# Patient Record
Sex: Female | Born: 1995 | Race: Black or African American | Hispanic: No | Marital: Single | Smoking: Current every day smoker
Health system: Southern US, Community
[De-identification: ages and names within clinical notes are randomized; demographics above are authoritative.]

---

## 2017-03-22 ENCOUNTER — Ambulatory Visit (HOSPITAL_COMMUNITY)
Admission: EM | Admit: 2017-03-22 | Discharge: 2017-03-22 | Disposition: A | Discharge status: Home / Self Care | Payer: BLUE CROSS/BLUE SHIELD | Attending: Physician Assistant | Admitting: Physician Assistant

## 2017-03-22 DIAGNOSIS — R509 Fever, unspecified: Secondary | ICD-10-CM | POA: Diagnosis not present

## 2017-03-22 DIAGNOSIS — Z3202 Encounter for pregnancy test, result negative: Secondary | ICD-10-CM

## 2017-03-22 DIAGNOSIS — R51 Headache: Secondary | ICD-10-CM | POA: Diagnosis not present

## 2017-03-22 DIAGNOSIS — R05 Cough: Secondary | ICD-10-CM | POA: Diagnosis not present

## 2017-03-22 DIAGNOSIS — M791 Myalgia, unspecified site: Secondary | ICD-10-CM | POA: Diagnosis not present

## 2017-03-22 DIAGNOSIS — Z20828 Contact with and (suspected) exposure to other viral communicable diseases: Secondary | ICD-10-CM | POA: Diagnosis not present

## 2017-03-22 DIAGNOSIS — R69 Illness, unspecified: Principal | ICD-10-CM

## 2017-03-22 DIAGNOSIS — J111 Influenza due to unidentified influenza virus with other respiratory manifestations: Secondary | ICD-10-CM

## 2017-03-22 LAB — POCT PREGNANCY, URINE: Preg Test, Ur: NEGATIVE

## 2017-03-22 MED ORDER — OSELTAMIVIR PHOSPHATE 75 MG PO CAPS: 75.0000 mg | ORAL_CAPSULE | Freq: Two times a day (BID) | ORAL | 0 refills | Status: AC

## 2017-03-22 MED ORDER — IBUPROFEN 600 MG PO TABS: 600.0000 mg | ORAL_TABLET | Freq: Four times a day (QID) | ORAL | 0 refills | Status: AC | PRN

## 2017-03-22 NOTE — ED Notes (Signed)
Seen, and tiraged by Deliah Bostonmichael clark, provider

## 2017-03-22 NOTE — Discharge Instructions (Signed)
Your pregnancy test is negative today.  Take the Tamiflu.  Take ibuprofen 600 mg as prescribed every 6-8 hours as needed.

## 2017-03-22 NOTE — ED Provider Notes (Signed)
03/22/2017 7:08 PM   DOB: [birth year] / MRN: [id number]  SUBJECTIVE:  Marilyn Finley is a 22 y.o. female presenting for cough, fever T-max 102, chills, body ache, headaches.  She associates fatigue.  She tells me that her nephew was recently diagnosed with the flu.  She has No Known Allergies.   She  has no past medical history on file.    She   She  has no sexual activity history on file. The patient  has no past surgical history on file.  Her family history is not on file.  Review of Systems  Constitutional: Negative for diaphoresis.  Respiratory: Negative for hemoptysis and shortness of breath.   Cardiovascular: Negative for chest pain.  Gastrointestinal: Negative for nausea.  Skin: Negative for rash.  Neurological: Negative for dizziness.    OBJECTIVE:  BP 124/82 (BP Location: Left Arm)   Pulse (!) 138 Comment: Notified Tina  Temp (!) 102.6 F (39.2 C) (Oral) Comment: Notified Tina G  Resp 20   SpO2 98%   Physical Exam  Constitutional: She is active.  Non-toxic appearance.  HENT:  Right Ear: Hearing, tympanic membrane, external ear and ear canal normal.  Left Ear: Hearing, tympanic membrane, external ear and ear canal normal.  Nose: Nose normal. Right sinus exhibits no maxillary sinus tenderness and no frontal sinus tenderness. Left sinus exhibits no maxillary sinus tenderness and no frontal sinus tenderness.  Mouth/Throat: Uvula is midline, oropharynx is clear and moist and mucous membranes are normal. Mucous membranes are not dry. No oropharyngeal exudate, posterior oropharyngeal edema or tonsillar abscesses.  Cardiovascular: Normal rate, regular rhythm, S1 normal, S2 normal, normal heart sounds and intact distal pulses. Exam reveals no gallop, no friction rub and no decreased pulses.  No murmur heard. Pulmonary/Chest: Effort normal. No stridor. No tachypnea. No respiratory distress. She has no wheezes. She has no rales.  Abdominal: She exhibits no distension.   Musculoskeletal: She exhibits no edema.  Lymphadenopathy:       Head (right side): No submandibular and no tonsillar adenopathy present.       Head (left side): No submandibular and no tonsillar adenopathy present.    She has no cervical adenopathy.  Neurological: She is alert.  Skin: Skin is warm and dry. She is not diaphoretic. No pallor.    Results for orders placed or performed during the hospital encounter of 03/22/17 (from the past 72 hour(s))  Pregnancy, urine POC     Status: None   Collection Time: 03/22/17  7:05 PM  Result Value Ref Range   Preg Test, Ur NEGATIVE NEGATIVE    Comment:        THE SENSITIVITY OF THIS METHODOLOGY IS >24 mIU/mL     No results found.  ASSESSMENT AND PLAN:     Influenza-like illness: Will start Tamiflu.  Symptomatic medication also prescribed for fever.      The patient is advised to call or return to clinic if she does not see an improvement in symptoms, or to seek the care of the closest emergency department if she worsens with the above plan.   Deliah BostonMichael Alandis Bluemel, MHS, PA-C 03/22/2017 7:08 PM    Ofilia Neaslark, Lucia Harm L, PA-C 03/22/17 Windell [id number]

## 2018-01-09 ENCOUNTER — Emergency Department (HOSPITAL_COMMUNITY)
Admission: EM | Admit: 2018-01-09 | Discharge: 2018-01-10 | Disposition: A | Discharge status: Home / Self Care | Payer: BLUE CROSS/BLUE SHIELD | Attending: Emergency Medicine | Admitting: Emergency Medicine

## 2018-01-09 ENCOUNTER — Encounter (HOSPITAL_COMMUNITY): Payer: Self-pay | Admitting: Emergency Medicine

## 2018-01-09 ENCOUNTER — Other Ambulatory Visit: Payer: Self-pay

## 2018-01-09 DIAGNOSIS — Z79899 Other long term (current) drug therapy: Secondary | ICD-10-CM | POA: Diagnosis not present

## 2018-01-09 DIAGNOSIS — F17200 Nicotine dependence, unspecified, uncomplicated: Secondary | ICD-10-CM | POA: Insufficient documentation

## 2018-01-09 DIAGNOSIS — R0789 Other chest pain: Secondary | ICD-10-CM | POA: Diagnosis not present

## 2018-01-09 NOTE — ED Triage Notes (Addendum)
Pt c/o mid chest pain and mid / upper back pain x 2 days. Pt states pain is constant and she has not taken any meds for the pain. Pt states pain worsens with movement.

## 2018-01-09 NOTE — ED Notes (Signed)
Patient reports going to the gym yesterday and working out her shoulders and triceps. After she got back from the gym she noticed the pain, 4/10 in her upper back and L chest

## 2018-01-10 ENCOUNTER — Emergency Department (HOSPITAL_COMMUNITY): Payer: BLUE CROSS/BLUE SHIELD

## 2018-01-10 MED ORDER — IBUPROFEN 200 MG PO TABS
600.0000 mg | ORAL_TABLET | Freq: Once | ORAL | Status: AC
  Administered 2018-01-10: 600 mg via ORAL
  Filled 2018-01-10: qty 3

## 2018-01-10 NOTE — ED Provider Notes (Signed)
[facility]-EMERGENCY DEPT Provider Note   CSN: [id number] Arrival date & time: 01/09/18  2144  Time seen 23:55 PM    History   Chief Complaint Chief Complaint  Patient presents with  . Chest Pain  . Back Pain    HPI Marilyn Finley is a 22 y.o. female.  HPI patient states last night she started having left upper chest pain that she describes as dull, tight, and aching and states it waxes and wanes.  It is worse with movement and feels better with sitting still.  She states she feels like it is harder to breathe but she denies shortness of breath.  She has a mild cough that started today and she is coughing up white and sometimes brown or yellow mucus.  She denies fever, nausea, vomiting, sore throat, rhinorrhea, or sneezing.  She also states she has some pain between her shoulder blades is also made worse with movement.  She denies any pain or swelling of her legs, she denies being on any type of hormonal treatment, she denies any recent travel.  I asked her about any change in her activity and she states there was none.  She states she goes to the gym however she did the usual routines that she normally does without injury.  PCP [facility] at [facility]   No past medical history on file.  There are no active problems to display for this patient.    The histories are not reviewed yet. Please review them in the "History" navigator section and refresh this SmartLink.   OB History   No obstetric history on file.      Home Medications    Prior to Admission medications   Medication Sig Start Date End Date Taking? Authorizing Provider  ibuprofen (ADVIL,MOTRIN) 600 MG tablet Take 1 tablet (600 mg total) by mouth every 6 (six) hours as needed. Patient not taking: Reported on 01/10/2018 03/22/17   Ofilia Neas, PA-C  oseltamivir (TAMIFLU) 75 MG capsule Take 1 capsule (75 mg total) by mouth every 12 (twelve) hours. Patient not taking: Reported on 01/10/2018  03/22/17   Ofilia Neas, PA-C    Family History No family history on file.  Social History Social History   Tobacco Use  . Smoking status: Current Every Day Smoker  . Smokeless tobacco: Never Used  Substance Use Topics  . Alcohol use: Yes  . Drug use: Never  employed Denies smoking cigarettes, she smokes tobacco in a pipe   Allergies   Patient has no known allergies.   Review of Systems Review of Systems  All other systems reviewed and are negative.    Physical Exam Updated Vital Signs BP 132/86   Pulse 75   Temp 98.3 F (36.8 C) (Oral)   Resp 15   Ht 5\' 1"  (1.549 m)   Wt 63.5 kg   LMP 01/03/2018 (Exact Date)   SpO2 99%   BMI 26.45 kg/m   Vital signs normal    Physical Exam Vitals signs and nursing note reviewed.  Constitutional:      General: She is not in acute distress.    Appearance: Normal appearance. She is well-developed. She is not ill-appearing or toxic-appearing.  HENT:     Head: Normocephalic and atraumatic.     Right Ear: External ear normal.     Left Ear: External ear normal.     Nose: Nose normal. No mucosal edema or rhinorrhea.     Mouth/Throat:  Dentition: No dental abscesses.     Pharynx: No uvula swelling.  Eyes:     Conjunctiva/sclera: Conjunctivae normal.     Pupils: Pupils are equal, round, and reactive to light.  Neck:     Musculoskeletal: Full passive range of motion without pain, normal range of motion and neck supple.  Cardiovascular:     Rate and Rhythm: Normal rate and regular rhythm.     Heart sounds: Normal heart sounds. No murmur. No friction rub. No gallop.   Pulmonary:     Effort: Pulmonary effort is normal. No respiratory distress.     Breath sounds: Normal breath sounds. No wheezing, rhonchi or rales.  Chest:     Chest wall: No tenderness or crepitus.       Comments: Area of pain noted Abdominal:     General: Bowel sounds are normal. There is no distension.     Palpations: Abdomen is soft.      Tenderness: There is no abdominal tenderness. There is no guarding or rebound.  Musculoskeletal: Normal range of motion.        General: No tenderness.       Back:     Comments: Moves all extremities well. Area of pain noted.  No pain to palpation and when I do range of motion of her arm it also does not reproduce pain.  Skin:    General: Skin is warm and dry.     Coloration: Skin is not pale.     Findings: No erythema or rash.  Neurological:     Mental Status: She is alert and oriented to person, place, and time.     Cranial Nerves: No cranial nerve deficit.  Psychiatric:        Mood and Affect: Mood is not anxious.        Speech: Speech normal.        Behavior: Behavior normal.      ED Treatments / Results  Labs (all labs ordered are listed, but only abnormal results are displayed) Labs Reviewed - No data to display  EKG EKG Interpretation  Date/Time:  Saturday January 09 2018 21:59:55 EST Ventricular Rate:  94 PR Interval:    QRS Duration: 68 QT Interval:  353 QTC Calculation: 442 R Axis:   81 Text Interpretation:  Sinus rhythm Probable left atrial enlargement No old tracing to compare Confirmed by Linwood Dibbles [phone]) on 01/09/2018 10:04:24 PM   Radiology Dg Chest 2 View  Result Date: 01/10/2018 CLINICAL DATA:  LEFT upper chest and back pain for a day, productive cough. EXAM: CHEST - 2 VIEW COMPARISON:  None. FINDINGS: Cardiomediastinal silhouette is normal. No pleural effusions or focal consolidations. Trachea projects midline and there is no pneumothorax. Soft tissue planes and included osseous structures are non-suspicious. IMPRESSION: Normal chest. Electronically Signed   By: Awilda Metro M.D.   On: 01/10/2018 00:30    Procedures Procedures (including critical care time)  Medications Ordered in ED Medications  ibuprofen (ADVIL,MOTRIN) tablet 600 mg (600 mg Oral Given 01/10/18 0025)     Initial Impression / Assessment and Plan / ED Course  I have  reviewed the triage vital signs and the nursing notes.  Pertinent labs & imaging results that were available during my care of the patient were reviewed by me and considered in my medical decision making (see chart for details).     Patient reports she had URI symptoms within the last 2 weeks and got better and then her cough restarted today.  Chest x-ray was done.  EKG was normal, patient has no risk factor for PE, DVT.  Her EKG is normal and symptoms do not sound cardiac.  I suspect she mainly has chest wall pain and can be treated with ibuprofen as an outpatient with heat and cold for comfort.  Final Clinical Impressions(s) / ED Diagnoses   Final diagnoses:  Chest wall pain    ED Discharge Orders    None    OTC ibuprofen  Plan discharge  Devoria AlbeIva Derricka Mertz, MD, Concha PyoFACEP     Brylin Stanislawski, MD 01/10/18 [phone]

## 2018-01-10 NOTE — Discharge Instructions (Addendum)
Use ice and heat for comfort.  Take ibuprofen 600 mg 4 times a day for pain.  Recheck if you get a fever, struggle to breathe, or your cough has not improved over the next 7 to 10 days.  Your chest x-ray was normal, no pneumonia or other abnormality.

## 2019-12-16 IMAGING — CR DG CHEST 2V
2 series · 2 of 2 positions shown · non-contrast
Comparison: None.

CLINICAL DATA: LEFT upper chest and back pain for a day, productive
cough.

EXAM:
CHEST - 2 VIEW

[w chest pa]
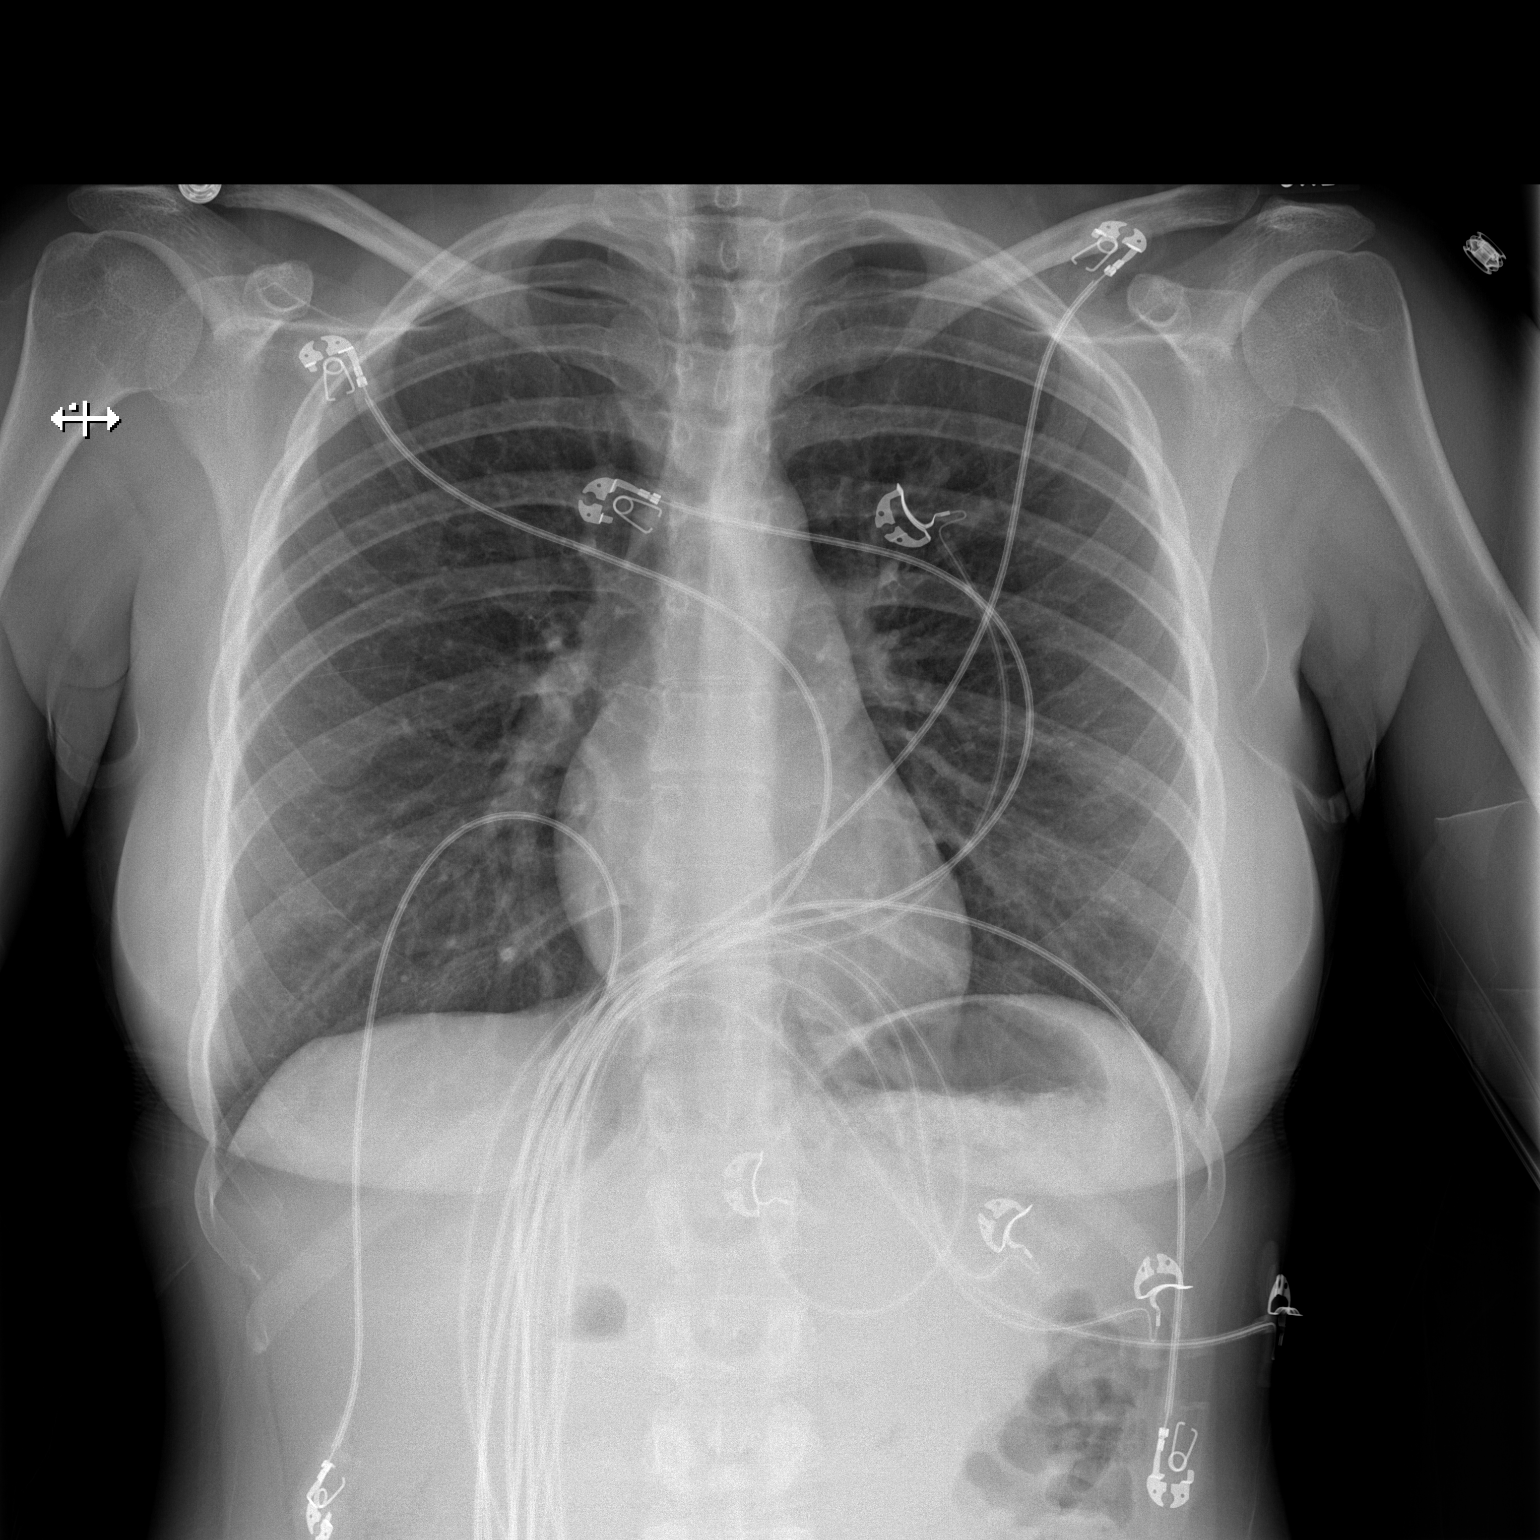

[w chest lat]
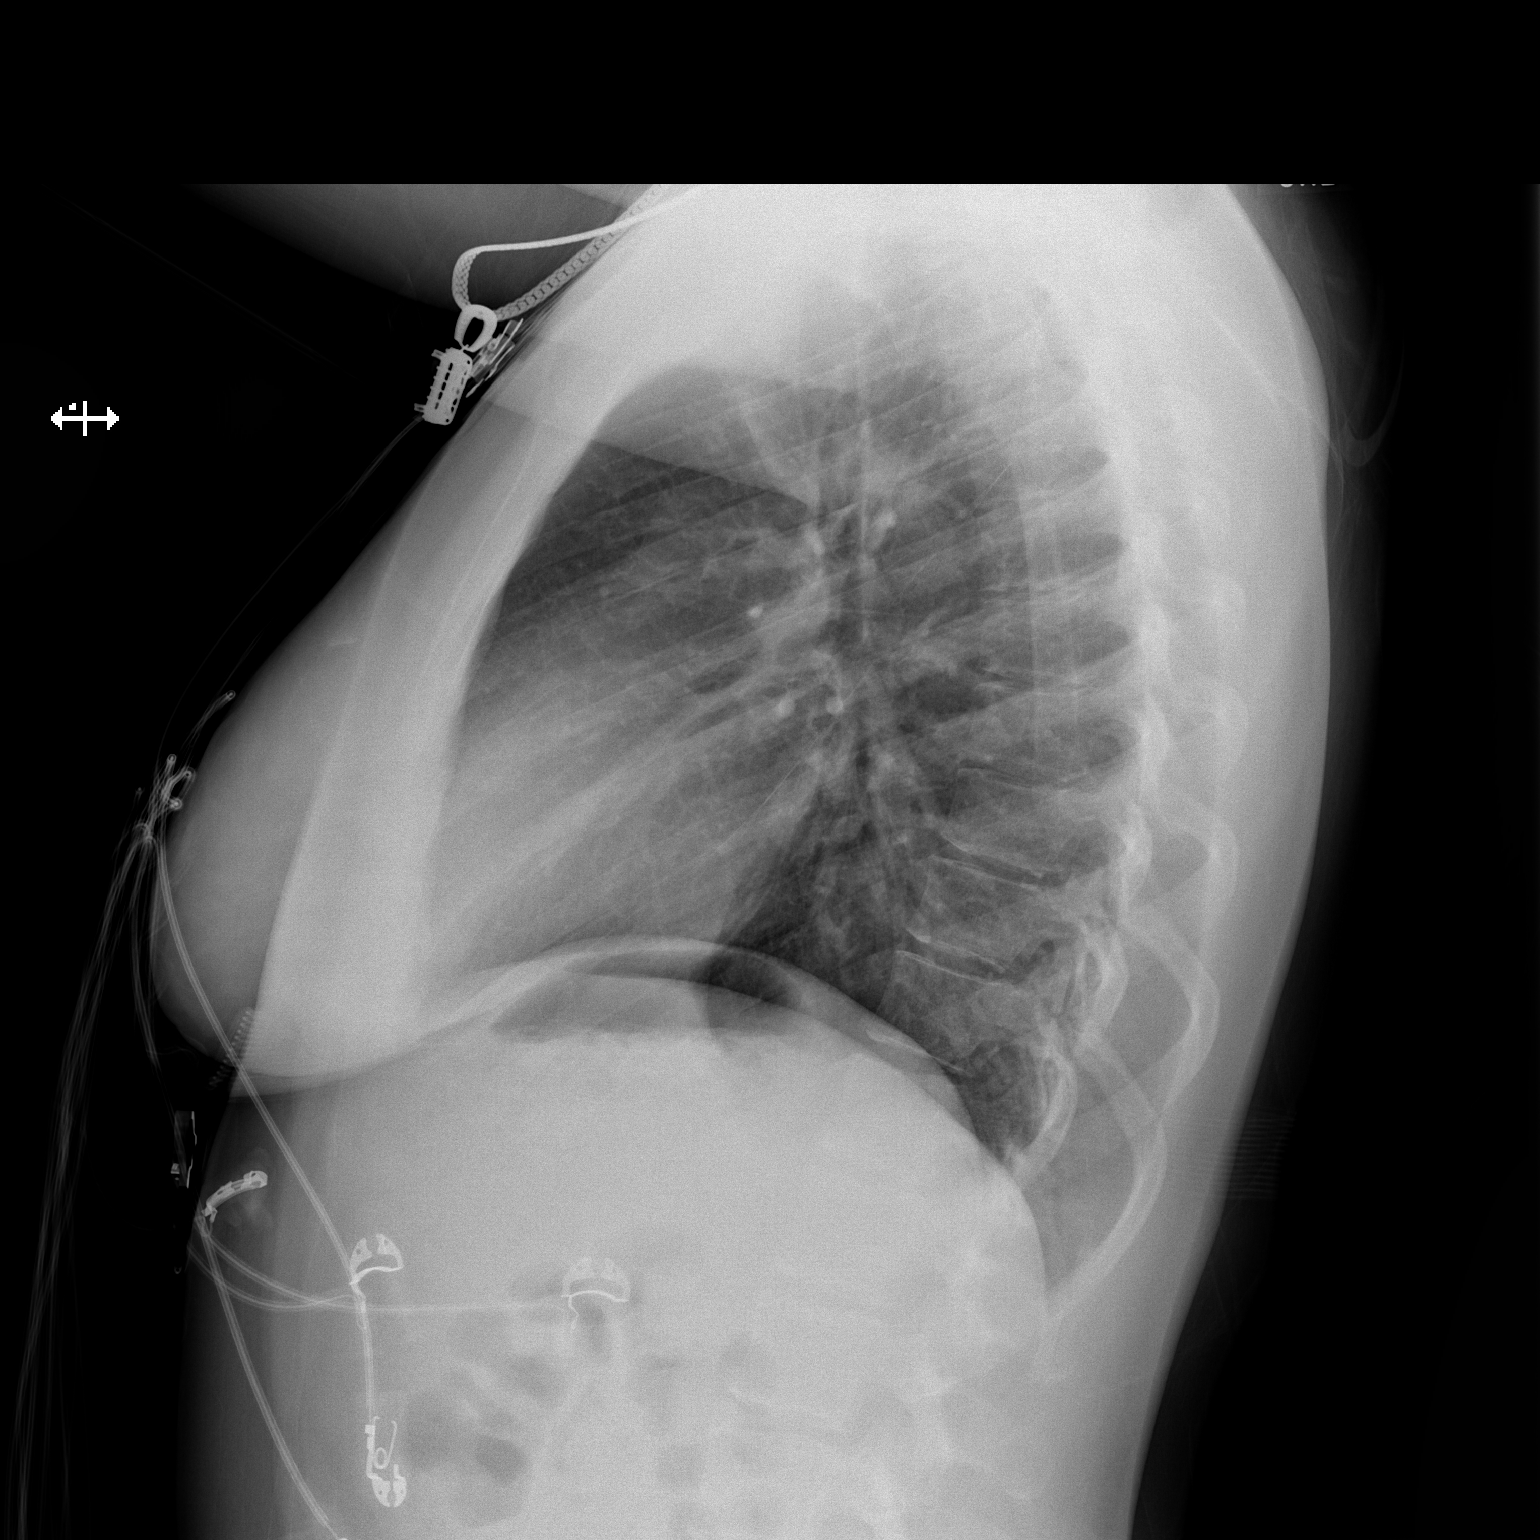

[2 of 2 positions shown; findings below may reference images not displayed]

FINDINGS: Cardiomediastinal silhouette is normal. No pleural effusions or
focal consolidations. Trachea projects midline and there is no
pneumothorax. Soft tissue planes and included osseous structures are
non-suspicious.
IMPRESSION: Normal chest.
